# Patient Record
Sex: Female | Born: 1960 | Race: White | Hispanic: No | Smoking: Current every day smoker
Health system: Southern US, Community
[De-identification: ages and names within clinical notes are randomized; demographics above are authoritative.]

## PROBLEM LIST (undated history)

## (undated) DIAGNOSIS — Z9889 Other specified postprocedural states: Secondary | ICD-10-CM

## (undated) DIAGNOSIS — M51369 Other intervertebral disc degeneration, lumbar region without mention of lumbar back pain or lower extremity pain: Secondary | ICD-10-CM

## (undated) DIAGNOSIS — M5126 Other intervertebral disc displacement, lumbar region: Secondary | ICD-10-CM

## (undated) DIAGNOSIS — M810 Age-related osteoporosis without current pathological fracture: Secondary | ICD-10-CM

## (undated) DIAGNOSIS — I493 Ventricular premature depolarization: Secondary | ICD-10-CM

## (undated) DIAGNOSIS — F329 Major depressive disorder, single episode, unspecified: Secondary | ICD-10-CM

## (undated) DIAGNOSIS — S22070A Wedge compression fracture of T9-T10 vertebra, initial encounter for closed fracture: Secondary | ICD-10-CM

## (undated) DIAGNOSIS — F32A Depression, unspecified: Secondary | ICD-10-CM

## (undated) DIAGNOSIS — M5136 Other intervertebral disc degeneration, lumbar region: Secondary | ICD-10-CM

## (undated) HISTORY — DX: Age-related osteoporosis without current pathological fracture: M81.0

## (undated) HISTORY — DX: Ventricular premature depolarization: I49.3

## (undated) HISTORY — DX: Other intervertebral disc degeneration, lumbar region without mention of lumbar back pain or lower extremity pain: M51.369

## (undated) HISTORY — DX: Other intervertebral disc degeneration, lumbar region: M51.36

## (undated) HISTORY — DX: Wedge compression fracture of t9-t10 vertebra, initial encounter for closed fracture: S22.070A

## (undated) HISTORY — DX: Other intervertebral disc displacement, lumbar region: M51.26

## (undated) HISTORY — DX: Other specified postprocedural states: Z98.890

## (undated) HISTORY — DX: Major depressive disorder, single episode, unspecified: F32.9

## (undated) HISTORY — PX: WISDOM TOOTH EXTRACTION: SHX21

## (undated) HISTORY — DX: Depression, unspecified: F32.A

---

## 2004-02-26 ENCOUNTER — Inpatient Hospital Stay: Payer: Self-pay | Admitting: Internal Medicine

## 2007-05-22 ENCOUNTER — Ambulatory Visit: Payer: Self-pay

## 2011-06-15 ENCOUNTER — Encounter: Payer: Self-pay | Admitting: Physician Assistant

## 2011-06-15 ENCOUNTER — Ambulatory Visit: Payer: Self-pay | Admitting: General Practice

## 2011-06-28 ENCOUNTER — Ambulatory Visit: Payer: Self-pay | Admitting: Pain Medicine

## 2011-07-02 ENCOUNTER — Encounter: Payer: Self-pay | Admitting: Physician Assistant

## 2011-07-12 ENCOUNTER — Ambulatory Visit: Payer: Self-pay | Admitting: Pain Medicine

## 2011-07-14 ENCOUNTER — Ambulatory Visit: Payer: Self-pay | Admitting: Pain Medicine

## 2011-07-28 ENCOUNTER — Ambulatory Visit: Payer: Self-pay | Admitting: Pain Medicine

## 2011-08-01 ENCOUNTER — Encounter: Payer: Self-pay | Admitting: Physician Assistant

## 2011-09-01 ENCOUNTER — Encounter: Payer: Self-pay | Admitting: Physician Assistant

## 2012-09-25 ENCOUNTER — Other Ambulatory Visit: Payer: Self-pay | Admitting: Orthopedic Surgery

## 2012-10-03 ENCOUNTER — Ambulatory Visit: Payer: Self-pay | Admitting: Orthopedic Surgery

## 2012-10-11 ENCOUNTER — Ambulatory Visit: Payer: Self-pay | Admitting: Orthopedic Surgery

## 2013-06-18 ENCOUNTER — Ambulatory Visit: Payer: Self-pay | Admitting: Orthopedic Surgery

## 2014-05-12 ENCOUNTER — Encounter: Payer: Self-pay | Admitting: *Deleted

## 2014-05-31 IMAGING — CR DG THORACIC SPINE 2-3V
1 series · 4 of 4 positions shown · non-contrast
Comparison: none

REASON FOR EXAM: follow up compression fracture
COMMENTS:

[Series 1: t thoracic spine ap · 0.14mm/px · 4 of 4 slices shown]
[im 1/4]
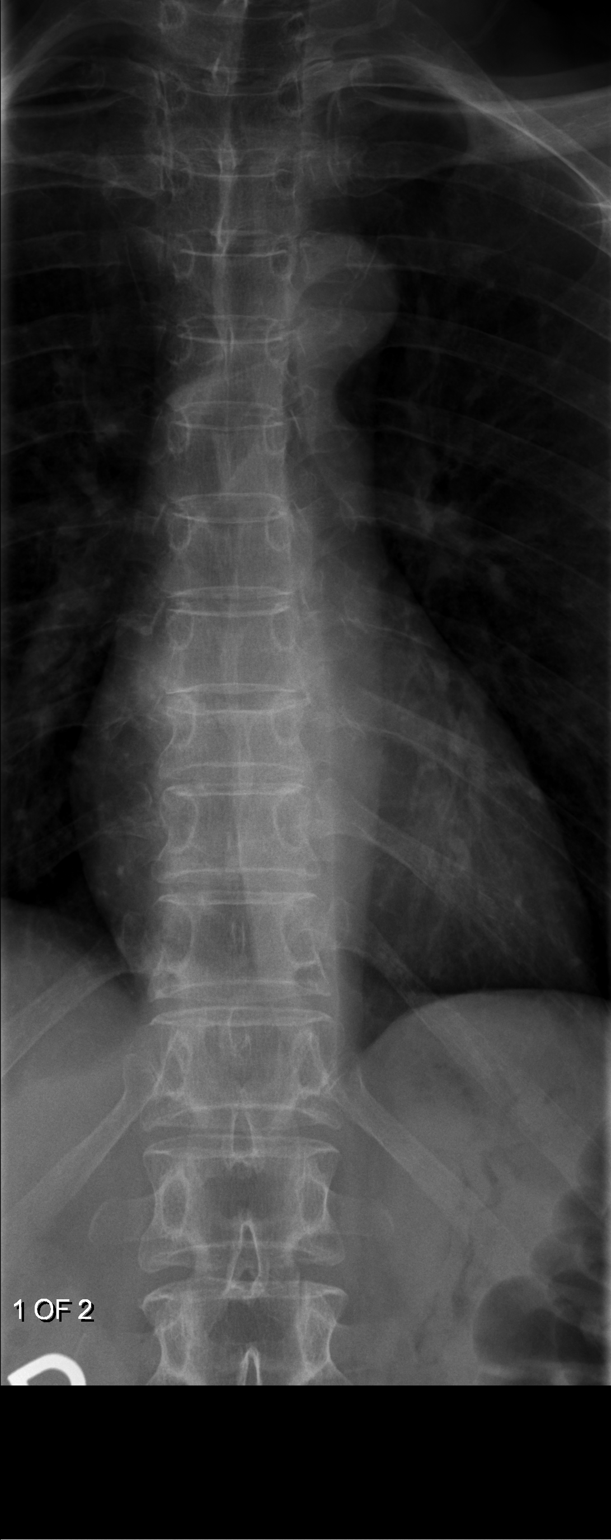
[im 2/4]
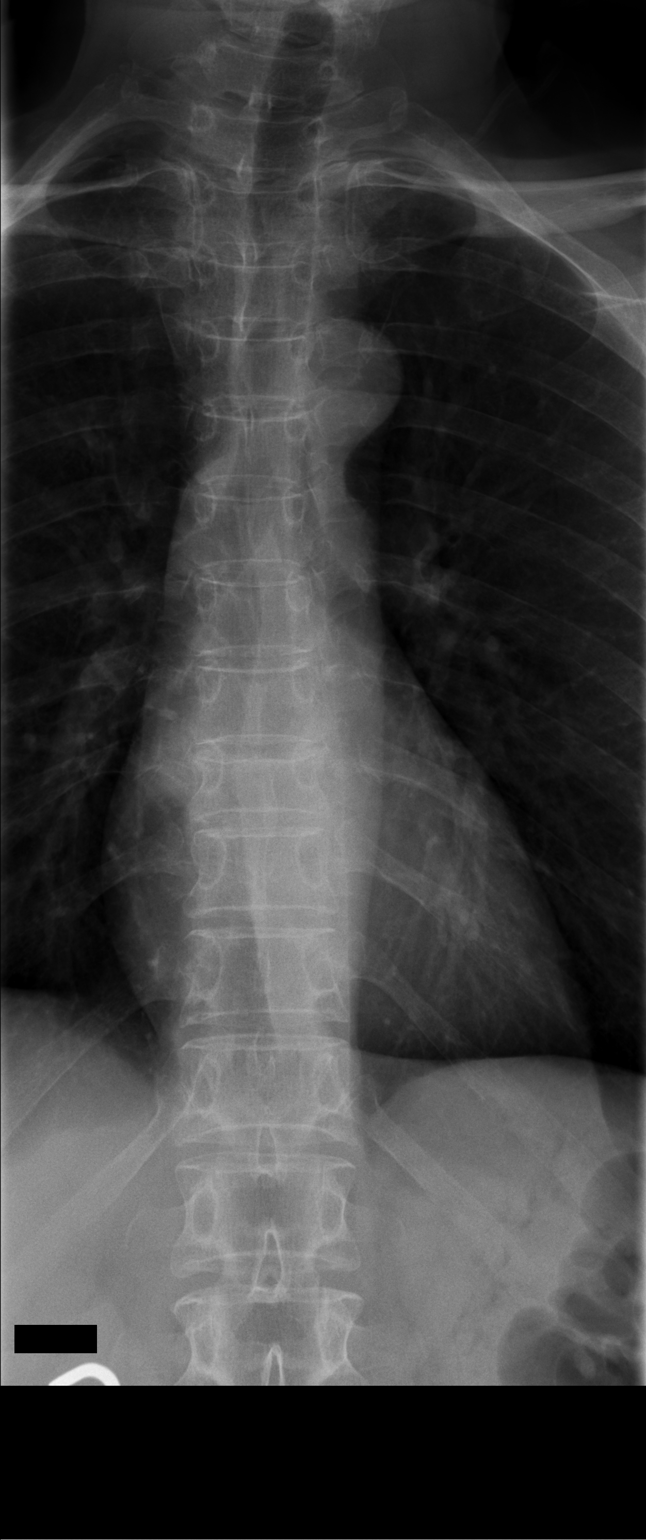
[im 3/4]
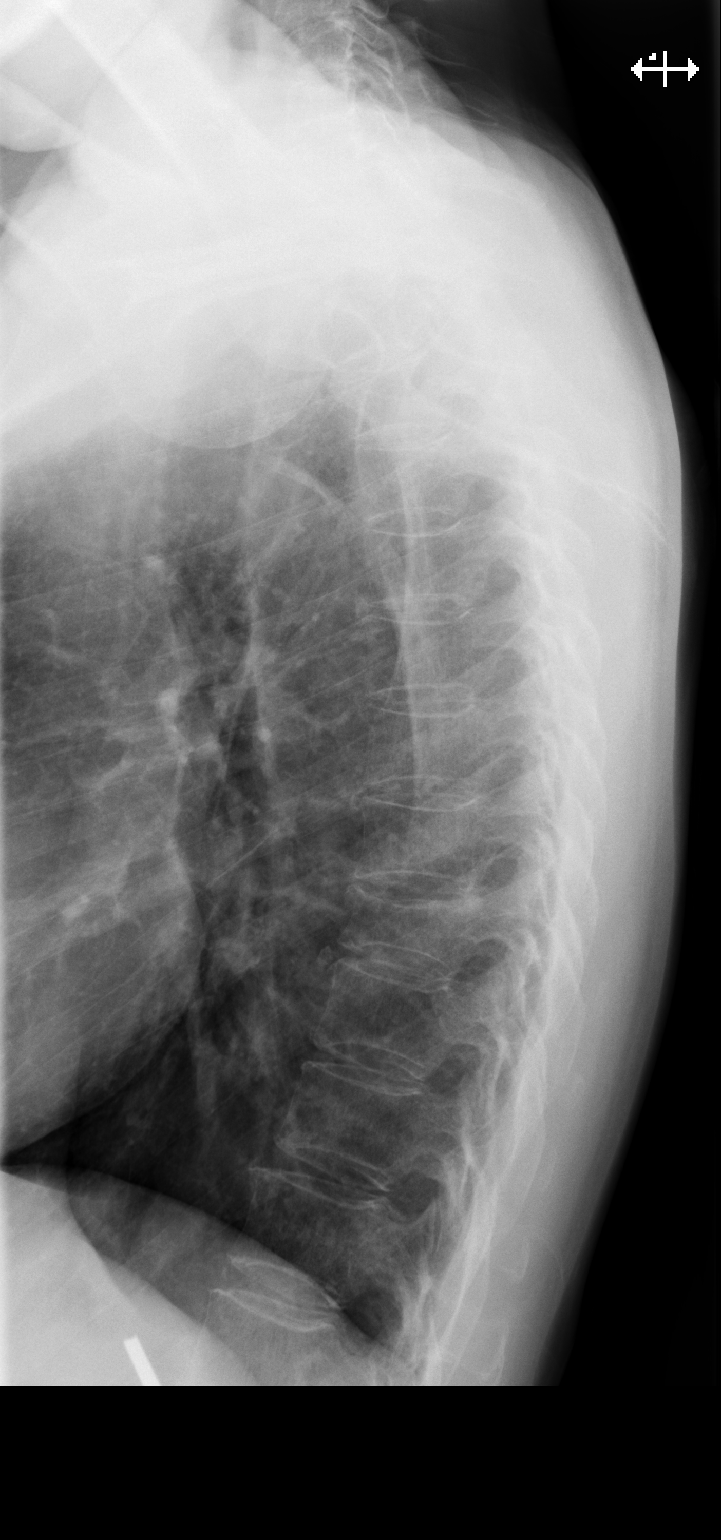
[im 4/4]
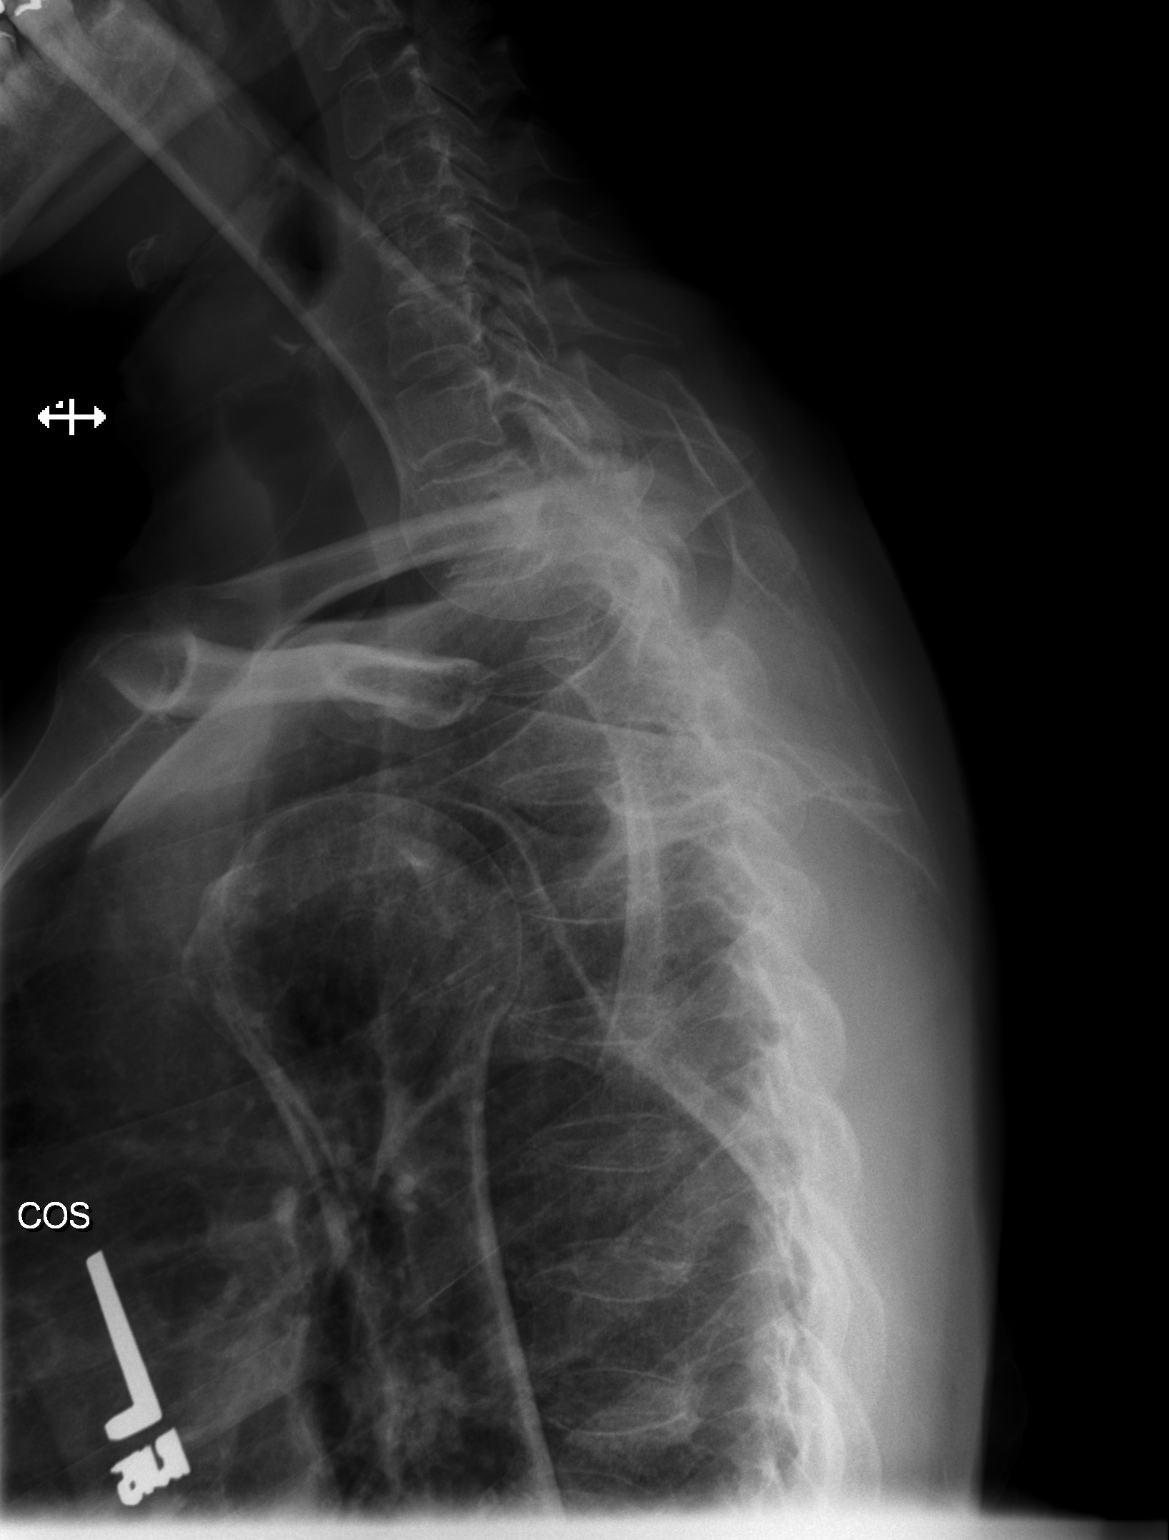

[4 of 4 positions shown; findings below may reference images not displayed]

PROCEDURE:     DXR - DXR THORACIC  AP AND LATERAL  - October 11, 2012  [DATE]

RESULT:     Comparison is made to the study September 25, 2012.

The thoracic vertebral bodies are diffusely osteopenic. There is mild
compression of the T9 vertebral body which has progressed slightly. The loss
of height anteriorly is approximately 40% and posteriorly is approximately
20%. No retropulsion of bone is demonstrated. There are no abnormal
paravertebral soft tissue densities. The pedicles appear intact were
visualized.
IMPRESSION: There is partial compression of body of T9 which has
slightly progressed since the previous study.

[REDACTED]

## 2015-02-24 ENCOUNTER — Ambulatory Visit (HOSPITAL_BASED_OUTPATIENT_CLINIC_OR_DEPARTMENT_OTHER): Payer: 59 | Admitting: Pain Medicine

## 2015-02-24 ENCOUNTER — Encounter: Payer: Self-pay | Admitting: Pain Medicine

## 2015-02-24 VITALS — BP 131/64 | HR 79 | Temp 98.5°F | Resp 16 | Ht 61.0 in | Wt 111.0 lb

## 2015-02-24 DIAGNOSIS — M79604 Pain in right leg: Secondary | ICD-10-CM

## 2015-02-24 DIAGNOSIS — G8929 Other chronic pain: Secondary | ICD-10-CM

## 2015-02-24 DIAGNOSIS — M47896 Other spondylosis, lumbar region: Secondary | ICD-10-CM

## 2015-02-24 DIAGNOSIS — M79605 Pain in left leg: Secondary | ICD-10-CM

## 2015-02-24 DIAGNOSIS — M792 Neuralgia and neuritis, unspecified: Secondary | ICD-10-CM | POA: Diagnosis not present

## 2015-02-24 DIAGNOSIS — M160 Bilateral primary osteoarthritis of hip: Secondary | ICD-10-CM

## 2015-02-24 DIAGNOSIS — M4806 Spinal stenosis, lumbar region: Secondary | ICD-10-CM

## 2015-02-24 DIAGNOSIS — M4306 Spondylolysis, lumbar region: Secondary | ICD-10-CM

## 2015-02-24 DIAGNOSIS — M5136 Other intervertebral disc degeneration, lumbar region: Secondary | ICD-10-CM | POA: Insufficient documentation

## 2015-02-24 DIAGNOSIS — M5416 Radiculopathy, lumbar region: Secondary | ICD-10-CM

## 2015-02-24 DIAGNOSIS — M4807 Spinal stenosis, lumbosacral region: Secondary | ICD-10-CM

## 2015-02-24 DIAGNOSIS — M5126 Other intervertebral disc displacement, lumbar region: Secondary | ICD-10-CM

## 2015-02-24 DIAGNOSIS — S22070S Wedge compression fracture of T9-T10 vertebra, sequela: Secondary | ICD-10-CM

## 2015-02-24 DIAGNOSIS — M5127 Other intervertebral disc displacement, lumbosacral region: Secondary | ICD-10-CM

## 2015-02-24 DIAGNOSIS — M51369 Other intervertebral disc degeneration, lumbar region without mention of lumbar back pain or lower extremity pain: Secondary | ICD-10-CM

## 2015-02-24 DIAGNOSIS — M545 Low back pain, unspecified: Secondary | ICD-10-CM | POA: Insufficient documentation

## 2015-02-24 DIAGNOSIS — M810 Age-related osteoporosis without current pathological fracture: Secondary | ICD-10-CM

## 2015-02-24 DIAGNOSIS — M47816 Spondylosis without myelopathy or radiculopathy, lumbar region: Secondary | ICD-10-CM | POA: Insufficient documentation

## 2015-02-24 DIAGNOSIS — M4317 Spondylolisthesis, lumbosacral region: Secondary | ICD-10-CM

## 2015-02-24 DIAGNOSIS — R52 Pain, unspecified: Secondary | ICD-10-CM

## 2015-02-24 DIAGNOSIS — Z5189 Encounter for other specified aftercare: Secondary | ICD-10-CM

## 2015-02-24 DIAGNOSIS — S22070A Wedge compression fracture of T9-T10 vertebra, initial encounter for closed fracture: Secondary | ICD-10-CM

## 2015-02-24 HISTORY — DX: Wedge compression fracture of T9-T10 vertebra, initial encounter for closed fracture: S22.070A

## 2015-02-24 MED ORDER — PREGABALIN 100 MG PO CAPS: 100.0000 mg | ORAL_CAPSULE | Freq: Every day | ORAL | Status: AC

## 2015-02-24 NOTE — Progress Notes (Signed)
Patient's Name: Carla Benson MRN: [id number] DOB: 16-Dec-1960 DOS: 02/24/2015  Primary Reason(s) for Visit: Evaluation of one or more chronic illness with mild exacerbation or progression CC: Back Pain   HPI  Ms. Karren is a 55 y.o. year old, female patient, who returns today as an established patient. She has Chronic low back pain (Bilateral) (L>R); Lower extremity pain (Bilateral) (L>R); Lumbar facet syndrome (Bilateral) (L>R); Neurogenic pain; Chronic lower extremity pain (Bilateral) (L>R) (referred from facet joints); Osteoarthritis of hip (Bilateral); History of lumbar radiculitis (intermittent) (Left) (L5); Lumbar bulging discs (broad-based disc bulge) (L3-4, L4-L5, and L5-S1); Lumbosacral foraminal stenosis (severe) (Left) (L5-S1); Herniated nucleus pulposus (Left) (L5-S1) (left foraminal, far lateral disc component which abuts the left extraforaminal L5 nerve root); Pain management at [facility]; Osteoporosis; Lumbar spondylosis; History of wedge compression fracture of T9 vertebra (HCC); Lumbar pars defect (Bilateral) (L5-S1); and Grade 1 Spondylolisthesis at L5-S1 level on her problem list.. Her primarily concern today is the Back Pain   The patient returns to the clinic today after last time being seen on 07/28/2011. At that time, the patient received the third in a series of 3 left sided interlaminar L5-S1 lumbar epidural steroid injections plus a left sided L5 selective nerve root block/transforaminal epidural steroid injection under fluoroscopic guidance. The patient indicates having done well for a while until recently when the pain started coming back. She is interested in getting this better since she has a goal of trying to run a marathon.  Reported Pain Score: 6 , clinically she looks like a 2-3/10. Reported level is inconsistent with clinical obrservations. Pain Type: Chronic pain Pain Location: Back Pain Orientation: Lower Pain Descriptors /  Indicators: Sharp, Radiating, Discomfort, Constant Pain Frequency: Constant  Last visit: 07/28/2011 Purpose: Left L5-S1 interlaminar epidural steroid injection plus left L4-5 transforaminal epidural steroid injection #3.  Pharmacotherapy  Medication(s): We have not prescribed any medications for this patient. According to the records, the only thing that she is taking is ibuprofen 800 mg 1 tablet by mouth daily to 3 times a day when necessary for pain.  Lab Work: Illicit Drugs No results found for: THCU, COCAINSCRNUR, PCPSCRNUR, MDMA, AMPHETMU, METHADONE, ETOH  Inflammation Markers No results found for: ESRSEDRATE, CRP  Renal Function No results found for: BUN, CREATININE, GFRAA, GFRNONAA  Hepatic Function No results found for: AST, ALT, ALBUMIN  Electrolytes No results found for: NA, K, CL, CALCIUM, MG  Allergies  Ms. Eisenhuth has no allergies on file.  Meds  The patient has a current medication list which includes the following prescription(s): ibuprofen and pregabalin.  No current outpatient prescriptions on file prior to visit.   No current facility-administered medications on file prior to visit.    ROS  Constitutional: Afebrile, no chills, well hydrated and well nourished Gastrointestinal: negative Musculoskeletal:negative Neurological: negative Behavioral/Psych: negative  PFSH  Medical:  Ms. Jacobson  has a past medical history of Depression; PVC (premature ventricular contraction); Bulging lumbar disc; Osteoporosis; cardiac cath; and History of wedge compression fracture of T9 vertebra (HCC) (02/24/2015). Family: family history includes Alzheimer's disease in her father; Brain cancer in her mother; COPD in her father. Surgical:  has past surgical history that includes Cesarean section; Cesarean section with bilateral tubal ligation; and Wisdom tooth extraction. Tobacco:  reports that she has been smoking Cigarettes.  She has a 15 pack-year smoking history. She does not  have any smokeless tobacco history on file. Alcohol:  reports that she drinks alcohol. Drug:  reports that she does not use illicit drugs.  Physical Exam  Vitals:  Today's Vitals   02/24/15 1421 02/24/15 1426  BP: 131/64   Pulse: 79   Temp: 98.5 F (36.9 C)   TempSrc: Oral   Resp: 16   Height:  (1.549 m)   Weight: 111 lb (50.349 kg)   SpO2: 100%   PainSc:  6     Calculated BMI: Body mass index is 20.98 kg/(m^2).  General appearance: alert, cooperative, appears stated age and mild distress Eyes: PERLA Respiratory: No evidence respiratory distress, no audible rales or ronchi and no use of accessory muscles of respiration  Thoracic Spine Inspection: No gross anomalies detected Alignment: Symetrical ROM: Adequate  Lumbar Spine Inspection: No gross anomalies detected Alignment: Symetrical ROM: Decreased hyperextension Palpation: Tender Provocative Tests: Lumbar Hyperextension and rotation test: Positive bilaterally Patrick's Maneuver: Negative bilaterally for pain arising from the sacroiliac joint, but positive bilaterally for pain arising from the hip joint. Gait: WNL  Lower Extremities Inspection: No gross anomalies detected ROM: Adequate Sensory: Normal Motor: Unremarkable  Toe walk (S1): WNL  Heal walk (L5): WNL Pulses: Palpable DTR:  Patellar (L4): WNL Achilles (S1): WNL  Assessment & Plan  Primary Diagnosis & Pertinent Problem List: The primary encounter diagnosis was Chronic low back pain. Diagnoses of Bilateral lower extremity pain, Facet syndrome, lumbar, Neurogenic pain, Chronic lower extremity pain (Left), Primary osteoarthritis of both hips, History of lumbar radiculitis (intermittent) (Left) (L5), Lumbar bulging discs (broad-based disc bulge) (L3-4, L4-L5, and L5-S1), Lumbosacral foraminal stenosis (severe) (Left) (L5-S1), Herniated nucleus pulposus (Left) (L5-S1) (left foraminal, far lateral disc component which abuts the left extraforaminal L5  nerve root), Pain management at [facility], Osteoporosis, Other osteoarthritis of spine, lumbar region, Closed wedge compression fracture of ninth thoracic vertebra, sequela (HCC), Lumbar pars defect (Bilateral) (L5-S1), and Grade 1 Spondylolisthesis at L5-S1 level were also pertinent to this visit.  Visit Diagnosis: 1. Chronic low back pain   2. Bilateral lower extremity pain   3. Facet syndrome, lumbar   4. Neurogenic pain   5. Chronic lower extremity pain (Left)   6. Primary osteoarthritis of both hips   7. History of lumbar radiculitis (intermittent) (Left) (L5)   8. Lumbar bulging discs (broad-based disc bulge) (L3-4, L4-L5, and L5-S1)   9. Lumbosacral foraminal stenosis (severe) (Left) (L5-S1)   10. Herniated nucleus pulposus (Left) (L5-S1) (left foraminal, far lateral disc component which abuts the left extraforaminal L5 nerve root)   11. Pain management at [facility]   12. Osteoporosis   13. Other osteoarthritis of spine, lumbar region   14. Closed wedge compression fracture of ninth thoracic vertebra, sequela (HCC)   15. Lumbar pars defect (Bilateral) (L5-S1)   16. Grade 1 Spondylolisthesis at L5-S1 level     Assessment: Pain management at [facility] The patient was initially evaluated at [facility] on 06/28/2011. At that time, the symptoms were dose of a left lumbar radiculitis. Lumbar MRI done on 06/15/2011. Treatment done: Left L5-S1 translaminar epidural steroid injection plus left L5-S1 transforaminal epidural steroid injection under fluoroscopic guidance x3. (06/28/2011, 07/14/2011, and 07/28/2011). X-rays of the lumbar spine done on 09/25/2012 show a grade 1 spondylolisthesis of L5 over S1 secondary to bilateral pars defects. X-rays of the thoracic spine done on 09/25/2012 show vertebral osteopenia with a mild partial compression fracture of T9 with approximately 20% loss of height  anteriorly. No more than 5-10% posterior height  loss. Bone density test done on 10/03/2012 reveals osteoporosis with high risk for fractures. X-rays of the thoracic spine done on 10/11/2012 demonstrated progression of the T9 vertebral body fracture to 40% anteriorly and 20% posteriorly.    Plan of Care  Pharmacotherapy (Medications Ordered): Meds ordered this encounter  Medications  . pregabalin (LYRICA) 100 MG capsule    Sig: Take 1-3 capsules (100-300 mg total) by mouth at bedtime. Follow titration schedule.    Dispense:  90 capsule    Refill:  0    Do not place this medication, or any other prescription from our practice, on "Automatic Refill". Patient may have prescription filled one day early if pharmacy is closed on scheduled refill date.    Lab-work & Procedure Ordered: Orders Placed This Encounter  Procedures  . LUMBAR FACET(MEDIAL BRANCH NERVE BLOCK) MBNB    Standing Status: Standing     Number of Occurrences: 1     Standing Expiration Date: 02/24/2016    Scheduling Instructions:     Side: Bilateral     Level: L2, L3, L4, L5, & S1 Medial Branch Nerve     Sedation: With Sedation.     Timeframe: PRN Procedure. Patient will call to schedule.    Order Specific Question:  Where will this procedure be performed?    Answer:  [facility]  . DG Lumbar Spine Complete W/Bend    Standing Status: Future     Number of Occurrences:      Standing Expiration Date: 02/24/2016    Scheduling Instructions:     Please include flexion and extension views and report any spinal instability (>4 mm displacement of any spondylolisthesis). If present, please report any spondylolisthesis grade, as well as displacement in millimeters.    Order Specific Question:  Reason for Exam (SYMPTOM  OR DIAGNOSIS REQUIRED)    Answer:  Low back pain    Order Specific Question:  Is the patient pregnant?    Answer:  No    Order Specific Question:  Preferred imaging location?    Answer:  [facility]     Order Specific Question:  Call Results- Best Contact Number?    Answer:  [phone] (Pain Clinic facility) (Dr. Laban Emperor)    Imaging Ordered: DG LUMBAR SPINE COMPLETE W/BEND 6+V  Interventional Therapies: Scheduled: None at this point. PRN Procedures: The patient was offered a diagnostic, bilateral, lumbar facet block under fluoroscopic guidance and IV sedation.    Referral(s) or Consult(s): None required at this point.  Medications administered during this visit: Ms. Lobello does not currently have medications on file.  No future appointments.  Primary Care Physician: No primary care provider on file. Location: [facility] Note by: Delmore Sear A. Laban Emperor, M.D, DABA, DABAPM, DABPM, DABIPP, FIPP

## 2015-02-24 NOTE — Progress Notes (Signed)
Safety precautions to be maintained throughout the outpatient stay will include: orient to surroundings, keep bed in low position, maintain call bell within reach at all times, provide assistance with transfer out of bed and ambulation.  

## 2015-02-24 NOTE — Assessment & Plan Note (Signed)
The patient was initially evaluated at [facility] on 06/28/2011. At that time, the symptoms were dose of a left lumbar radiculitis. Lumbar MRI done on 06/15/2011. Treatment done: Left L5-S1 translaminar epidural steroid injection plus left L5-S1 transforaminal epidural steroid injection under fluoroscopic guidance x3. (06/28/2011, 07/14/2011, and 07/28/2011). X-rays of the lumbar spine done on 09/25/2012 show a grade 1 spondylolisthesis of L5 over S1 secondary to bilateral pars defects. X-rays of the thoracic spine done on 09/25/2012 show vertebral osteopenia with a mild partial compression fracture of T9 with approximately 20% loss of height anteriorly. No more than 5-10% posterior height loss. Bone density test done on 10/03/2012 reveals osteoporosis with high risk for fractures. X-rays of the thoracic spine done on 10/11/2012 demonstrated progression of the T9 vertebral body fracture to 40% anteriorly and 20% posteriorly.

## 2015-02-24 NOTE — Patient Instructions (Signed)
Facet Joint Block The facet joints connect the bones of the spine (vertebrae). They make it possible for you to bend, twist, and make other movements with your spine. They also prevent you from overbending, overtwisting, and making other excessive movements.  A facet joint block is a procedure where a numbing medicine (anesthetic) is injected into a facet joint. Often, a type of anti-inflammatory medicine called a steroid is also injected. A facet joint block may be done for two reasons:   Diagnosis. A facet joint block may be done as a test to see whether neck or back pain is caused by a worn-down or infected facet joint. If the pain gets better after a facet joint block, it means the pain is probably coming from the facet joint. If the pain does not get better, it means the pain is probably not coming from the facet joint.   Therapy. A facet joint block may be done to relieve neck or back pain caused by a facet joint. A facet joint block is only done as a therapy if the pain does not improve with medicine, exercise programs, physical therapy, and other forms of pain management. LET YOUR HEALTH CARE PROVIDER KNOW ABOUT:   Any allergies you have.   All medicines you are taking, including vitamins, herbs, eyedrops, and over-the-counter medicines and creams.   Previous problems you or members of your family have had with the use of anesthetics.   Any blood disorders you have had.   Other health problems you have. RISKS AND COMPLICATIONS Generally, having a facet joint block is safe. However, as with any procedure, complications can occur. Possible complications associated with having a facet joint block include:   Bleeding.   Injury to a nerve near the injection site.   Pain at the injection site.   Weakness or numbness in areas controlled by nerves near the injection site.   Infection.   Temporary fluid retention.   Allergic reaction to anesthetics or medicines used during  the procedure. BEFORE THE PROCEDURE   Follow your health care provider's instructions if you are taking dietary supplements or medicines. You may need to stop taking them or reduce your dosage.   Do not take any new dietary supplements or medicines without asking your health care provider first.   Follow your health care provider's instructions about eating and drinking before the procedure. You may need to stop eating and drinking several hours before the procedure.   Arrange to have an adult drive you home after the procedure. PROCEDURE  You may need to remove your clothing and dress in an open-back gown so that your health care provider can access your spine.   The procedure will be done while you are lying on an X-ray table. Most of the time you will be asked to lie on your stomach, but you may be asked to lie in a different position if an injection will be made in your neck.   Special machines will be used to monitor your oxygen levels, heart rate, and blood pressure.   If an injection will be made in your neck, an intravenous (IV) tube will be inserted into one of your veins. Fluids and medicine will flow directly into your body through the IV tube.   The area over the facet joint where the injection will be made will be cleaned with an antiseptic soap. The surrounding skin will be covered with sterile drapes.   An anesthetic will be applied to your skin   to make the injection area numb. You may feel a temporary stinging or burning sensation.   A video X-ray machine will be used to locate the joint. A contrast dye may be injected into the facet joint area to help with locating the joint.   When the joint is located, an anesthetic medicine will be injected into the joint through the needle.   Your health care provider will ask you whether you feel pain relief. If you do feel relief, a steroid may be injected to provide pain relief for a longer period of time. If you do not  feel relief or feel only partial relief, additional injections of an anesthetic may be made in other facet joints.   The needle will be removed, the skin will be cleansed, and bandages will be applied.  AFTER THE PROCEDURE   You will be observed for 15-30 minutes before being allowed to go home. Do not drive. Have an adult drive you or take a taxi or public transportation instead.   If you feel pain relief, the pain will return in several hours or days when the anesthetic wears off.   You may feel pain relief 2-14 days after the procedure. The amount of time this relief lasts varies from person to person.   It is normal to feel some tenderness over the injected area(s) for 2 days following the procedure.   If you have diabetes, you may have a temporary increase in blood sugar.   This information is not intended to replace advice given to you by your health care provider. Make sure you discuss any questions you have with your health care provider.   Document Released: 06/08/2006 Document Revised: 02/07/2014 Document Reviewed: 11/07/2011 Elsevier Interactive Patient Education 2016 [employer] GENERAL RISKS AND COMPLICATIONS  What are the risk, side effects and possible complications? Generally speaking, most procedures are safe.  However, with any procedure there are risks, side effects, and the possibility of complications.  The risks and complications are dependent upon the sites that are lesioned, or the type of nerve block to be performed.  The closer the procedure is to the spine, the more serious the risks are.  Great care is taken when placing the radio frequency needles, block needles or lesioning probes, but sometimes complications can occur.  Infection: Any time there is an injection through the skin, there is a risk of infection.  This is why sterile conditions are used for these blocks.  There are four possible types of infection.  Localized skin infection.  Central  Nervous System Infection-This can be in the form of Meningitis, which can be deadly.  Epidural Infections-This can be in the form of an epidural abscess, which can cause pressure inside of the spine, causing compression of the spinal cord with subsequent paralysis. This would require an emergency surgery to decompress, and there are no guarantees that the patient would recover from the paralysis.  Discitis-This is an infection of the intervertebral discs.  It occurs in about 1% of discography procedures.  It is difficult to treat and it may lead to surgery.        2. Pain: the needles have to go through skin and soft tissues, will cause soreness.       3. Damage to internal structures:  The nerves to be lesioned may be near blood vessels or    other nerves which can be potentially damaged.       4. Bleeding: Bleeding is more common if   the patient is taking blood thinners such as  aspirin, Coumadin, Ticiid, Plavix, etc., or if he/she have some genetic predisposition  such as hemophilia. Bleeding into the spinal canal can cause compression of the spinal  cord with subsequent paralysis.  This would require an emergency surgery to  decompress and there are no guarantees that the patient would recover from the  paralysis.       5. Pneumothorax:  Puncturing of a lung is a possibility, every time a needle is introduced in  the area of the chest or upper back.  Pneumothorax refers to free air around the  collapsed lung(s), inside of the thoracic cavity (chest cavity).  Another two possible  complications related to a similar event would include: Hemothorax and Chylothorax.   These are variations of the Pneumothorax, where instead of air around the collapsed  lung(s), you may have blood or chyle, respectively.       6. Spinal headaches: They may occur with any procedures in the area of the spine.       7. Persistent CSF (Cerebro-Spinal Fluid) leakage: This is a rare problem, but may occur  with prolonged  intrathecal or epidural catheters either due to the formation of a fistulous  track or a dural tear.       8. Nerve damage: By working so close to the spinal cord, there is always a possibility of  nerve damage, which could be as serious as a permanent spinal cord injury with  paralysis.       9. Death:  Although rare, severe deadly allergic reactions known as "Anaphylactic  reaction" can occur to any of the medications used.      10. Worsening of the symptoms:  We can always make thing worse.  What are the chances of something like this happening? Chances of any of this occuring are extremely low.  By statistics, you have more of a chance of getting killed in a motor vehicle accident: while driving to the hospital than any of the above occurring .  Nevertheless, you should be aware that they are possibilities.  In general, it is similar to taking a shower.  Everybody knows that you can slip, hit your head and get killed.  Does that mean that you should not shower again?  Nevertheless always keep in mind that statistics do not mean anything if you happen to be on the wrong side of them.  Even if a procedure has a 1 (one) in a 1,000,000 (million) chance of going wrong, it you happen to be that one..Also, keep in mind that by statistics, you have more of a chance of having something go wrong when taking medications.  Who should not have this procedure? If you are on a blood thinning medication (e.g. Coumadin, Plavix, see list of "Blood Thinners"), or if you have an active infection going on, you should not have the procedure.  If you are taking any blood thinners, please inform your physician.  How should I prepare for this procedure?  Do not eat or drink anything at least six hours prior to the procedure.  Bring a driver with you .  It cannot be a taxi.  Come accompanied by an adult that can drive you back, and that is strong enough to help you if your legs get weak or numb from the local  anesthetic.  Take all of your medicines the morning of the procedure with just enough water to swallow them.  If you have   diabetes, make sure that you are scheduled to have your procedure done first thing in the morning, whenever possible.  If you have diabetes, take only half of your insulin dose and notify our nurse that you have done so as soon as you arrive at the clinic.  If you are diabetic, but only take blood sugar pills (oral hypoglycemic), then do not take them on the morning of your procedure.  You may take them after you have had the procedure.  Do not take aspirin or any aspirin-containing medications, at least eleven (11) days prior to the procedure.  They may prolong bleeding.  Wear loose fitting clothing that may be easy to take off and that you would not mind if it got stained with Betadine or blood.  Do not wear any jewelry or perfume  Remove any nail coloring.  It will interfere with some of our monitoring equipment.  NOTE: Remember that this is not meant to be interpreted as a complete list of all possible complications.  Unforeseen problems may occur.  BLOOD THINNERS The following drugs contain aspirin or other products, which can cause increased bleeding during surgery and should not be taken for 2 weeks prior to and 1 week after surgery.  If you should need take something for relief of minor pain, you may take acetaminophen which is found in Tylenol,m Datril, Anacin-3 and Panadol. It is not blood thinner. The products listed below are.  Do not take any of the products listed below in addition to any listed on your instruction sheet.  A.P.C or A.P.C with Codeine Codeine Phosphate Capsules #3 Ibuprofen Ridaura  ABC compound Congesprin Imuran rimadil  Advil Cope Indocin Robaxisal  Alka-Seltzer Effervescent Pain Reliever and Antacid Coricidin or Coricidin-D  Indomethacin Rufen  Alka-Seltzer plus Cold Medicine Cosprin Ketoprofen S-A-C Tablets  Anacin Analgesic Tablets  or Capsules Coumadin Korlgesic Salflex  Anacin Extra Strength Analgesic tablets or capsules CP-2 Tablets Lanoril Salicylate  Anaprox Cuprimine Capsules Levenox Salocol  Anexsia-D Dalteparin Magan Salsalate  Anodynos Darvon compound Magnesium Salicylate Sine-off  Ansaid Dasin Capsules Magsal Sodium Salicylate  Anturane Depen Capsules Marnal Soma  APF Arthritis pain formula Dewitt's Pills Measurin Stanback  Argesic Dia-Gesic Meclofenamic Sulfinpyrazone  Arthritis Bayer Timed Release Aspirin Diclofenac Meclomen Sulindac  Arthritis pain formula Anacin Dicumarol Medipren Supac  Analgesic (Safety coated) Arthralgen Diffunasal Mefanamic Suprofen  Arthritis Strength Bufferin Dihydrocodeine Mepro Compound Suprol  Arthropan liquid Dopirydamole Methcarbomol with Aspirin Synalgos  ASA tablets/Enseals Disalcid Micrainin Tagament  Ascriptin Doan's Midol Talwin  Ascriptin A/D Dolene Mobidin Tanderil  Ascriptin Extra Strength Dolobid Moblgesic Ticlid  Ascriptin with Codeine Doloprin or Doloprin with Codeine Momentum Tolectin  Asperbuf Duoprin Mono-gesic Trendar  Aspergum Duradyne Motrin or Motrin IB Triminicin  Aspirin plain, buffered or enteric coated Durasal Myochrisine Trigesic  Aspirin Suppositories Easprin Nalfon Trillsate  Aspirin with Codeine Ecotrin Regular or Extra Strength Naprosyn Uracel  Atromid-S Efficin Naproxen Ursinus  Auranofin Capsules Elmiron Neocylate Vanquish  Axotal Emagrin Norgesic Verin  Azathioprine Empirin or Empirin with Codeine Normiflo Vitamin E  Azolid Emprazil Nuprin Voltaren  Bayer Aspirin plain, buffered or children's or timed BC Tablets or powders Encaprin Orgaran Warfarin Sodium  Buff-a-Comp Enoxaparin Orudis Zorpin  Buff-a-Comp with Codeine Equegesic Os-Cal-Gesic   Buffaprin Excedrin plain, buffered or Extra Strength Oxalid   Bufferin Arthritis Strength Feldene Oxphenbutazone   Bufferin plain or Extra Strength Feldene Capsules Oxycodone with Aspirin   Bufferin  with Codeine Fenoprofen Fenoprofen Pabalate or Pabalate-SF   Buffets II Flogesic Panagesic     Buffinol plain or Extra Strength Florinal or Florinal with Codeine Panwarfarin   Buf-Tabs Flurbiprofen Penicillamine   Butalbital Compound Four-way cold tablets Penicillin   Butazolidin Fragmin Pepto-Bismol   Carbenicillin Geminisyn Percodan   Carna Arthritis Reliever Geopen Persantine   Carprofen Gold's salt Persistin   Chloramphenicol Goody's Phenylbutazone   Chloromycetin Haltrain Piroxlcam   Clmetidine heparin Plaquenil   Cllnoril Hyco-pap Ponstel   Clofibrate Hydroxy chloroquine Propoxyphen         Before stopping any of these medications, be sure to consult the physician who ordered them.  Some, such as Coumadin (Warfarin) are ordered to prevent or treat serious conditions such as "deep thrombosis", "pumonary embolisms", and other heart problems.  The amount of time that you may need off of the medication may also vary with the medication and the reason for which you were taking it.  If you are taking any of these medications, please make sure you notify your pain physician before you undergo any procedures.          

## 2015-02-25 ENCOUNTER — Telehealth: Payer: Self-pay

## 2015-02-25 NOTE — Telephone Encounter (Signed)
Called 02/25/15  and LM to come fill out new patient paper work and get Prescription Recalled 02/25/15 @ 1315 and LM Recalled 02/25/15  and LM

## 2015-02-26 ENCOUNTER — Ambulatory Visit
Admission: RE | Admit: 2015-02-26 | Discharge: 2015-02-26 | Disposition: A | Discharge status: Home / Self Care | Payer: 59 | Source: Ambulatory Visit | Attending: Pain Medicine | Admitting: Pain Medicine

## 2015-02-26 DIAGNOSIS — G8929 Other chronic pain: Secondary | ICD-10-CM | POA: Diagnosis not present

## 2015-02-26 DIAGNOSIS — F17210 Nicotine dependence, cigarettes, uncomplicated: Secondary | ICD-10-CM | POA: Insufficient documentation

## 2015-02-26 DIAGNOSIS — M4854XA Collapsed vertebra, not elsewhere classified, thoracic region, initial encounter for fracture: Secondary | ICD-10-CM | POA: Diagnosis not present

## 2015-02-26 DIAGNOSIS — M160 Bilateral primary osteoarthritis of hip: Secondary | ICD-10-CM | POA: Insufficient documentation

## 2015-02-26 DIAGNOSIS — M4807 Spinal stenosis, lumbosacral region: Secondary | ICD-10-CM | POA: Diagnosis not present

## 2015-02-26 DIAGNOSIS — M47816 Spondylosis without myelopathy or radiculopathy, lumbar region: Secondary | ICD-10-CM | POA: Diagnosis not present

## 2015-02-26 DIAGNOSIS — M5126 Other intervertebral disc displacement, lumbar region: Secondary | ICD-10-CM | POA: Insufficient documentation

## 2015-02-26 DIAGNOSIS — M4316 Spondylolisthesis, lumbar region: Secondary | ICD-10-CM | POA: Insufficient documentation

## 2015-02-26 DIAGNOSIS — M545 Low back pain, unspecified: Secondary | ICD-10-CM

## 2015-02-26 DIAGNOSIS — M79604 Pain in right leg: Secondary | ICD-10-CM | POA: Insufficient documentation

## 2015-02-26 DIAGNOSIS — M79605 Pain in left leg: Secondary | ICD-10-CM | POA: Insufficient documentation

## 2015-02-26 DIAGNOSIS — M810 Age-related osteoporosis without current pathological fracture: Secondary | ICD-10-CM | POA: Insufficient documentation

## 2015-02-26 DIAGNOSIS — F329 Major depressive disorder, single episode, unspecified: Secondary | ICD-10-CM | POA: Insufficient documentation

## 2015-03-02 ENCOUNTER — Telehealth: Payer: Self-pay | Admitting: Pain Medicine

## 2015-03-02 NOTE — Telephone Encounter (Signed)
Would like results of xray done 02-26-15 please call her at [phone] until 4pm or home

## 2015-03-02 NOTE — Telephone Encounter (Signed)
Results given to patient

## 2015-03-11 DIAGNOSIS — F329 Major depressive disorder, single episode, unspecified: Secondary | ICD-10-CM | POA: Diagnosis not present

## 2015-03-11 DIAGNOSIS — F419 Anxiety disorder, unspecified: Secondary | ICD-10-CM | POA: Diagnosis not present

## 2015-03-14 NOTE — Progress Notes (Signed)
Quick Note:  This imaging study has been reviewed and not found to have any major pathology requiring urgent or emergency care. Imaging reveals: Diffuse degenerative change lumbar spine. Stable L5-S1 spondylolisthesis with bilateral pars defects. No interim change from 09/25/2012. No acute abnormality.  Consider LESIs vs Lumbar Facet Blocks. ______

## 2015-09-21 ENCOUNTER — Encounter: Payer: Self-pay | Admitting: Physician Assistant

## 2015-09-21 ENCOUNTER — Ambulatory Visit: Payer: Self-pay | Admitting: Physician Assistant

## 2015-09-21 DIAGNOSIS — L237 Allergic contact dermatitis due to plants, except food: Secondary | ICD-10-CM

## 2015-09-21 MED ORDER — DEXAMETHASONE SODIUM PHOSPHATE 10 MG/ML IJ SOLN
10.0000 mg | Freq: Once | INTRAMUSCULAR | Status: AC
  Administered 2015-09-21: 10 mg via INTRAMUSCULAR

## 2015-09-21 NOTE — Progress Notes (Signed)
S: c/o itchy rash on hands, arms, legs, was outside in yard and then broke out, sx for few days, tried multiple otc meds without relief, denies fever/chills  O: vitals wnl, nad, lungs c t a, cv rrr, skin with small raised red areas some with streaks/blisters, no drainage, n/v intact  A: acute contact dermatitis  P: decadron 10 mg im

## 2016-01-08 DIAGNOSIS — F419 Anxiety disorder, unspecified: Secondary | ICD-10-CM | POA: Diagnosis not present

## 2016-01-08 DIAGNOSIS — F329 Major depressive disorder, single episode, unspecified: Secondary | ICD-10-CM | POA: Diagnosis not present

## 2016-05-11 DIAGNOSIS — F329 Major depressive disorder, single episode, unspecified: Secondary | ICD-10-CM | POA: Diagnosis not present

## 2016-05-11 DIAGNOSIS — F419 Anxiety disorder, unspecified: Secondary | ICD-10-CM | POA: Diagnosis not present

## 2016-06-16 DIAGNOSIS — M4316 Spondylolisthesis, lumbar region: Secondary | ICD-10-CM | POA: Diagnosis not present

## 2016-06-16 DIAGNOSIS — M5416 Radiculopathy, lumbar region: Secondary | ICD-10-CM | POA: Diagnosis not present

## 2016-06-17 ENCOUNTER — Other Ambulatory Visit: Payer: Self-pay | Admitting: Orthopedic Surgery

## 2016-06-17 DIAGNOSIS — M5416 Radiculopathy, lumbar region: Secondary | ICD-10-CM

## 2016-07-05 ENCOUNTER — Ambulatory Visit
Admission: RE | Admit: 2016-07-05 | Discharge: 2016-07-05 | Disposition: A | Discharge status: Home / Self Care | Payer: 59 | Source: Ambulatory Visit | Attending: Orthopedic Surgery | Admitting: Orthopedic Surgery

## 2016-07-05 DIAGNOSIS — M4316 Spondylolisthesis, lumbar region: Secondary | ICD-10-CM | POA: Diagnosis not present

## 2016-07-05 DIAGNOSIS — M48061 Spinal stenosis, lumbar region without neurogenic claudication: Secondary | ICD-10-CM | POA: Insufficient documentation

## 2016-07-05 DIAGNOSIS — M5416 Radiculopathy, lumbar region: Secondary | ICD-10-CM | POA: Diagnosis not present

## 2016-07-05 DIAGNOSIS — M545 Low back pain: Secondary | ICD-10-CM | POA: Diagnosis not present

## 2016-07-28 DIAGNOSIS — M4306 Spondylolysis, lumbar region: Secondary | ICD-10-CM | POA: Diagnosis not present

## 2016-07-28 DIAGNOSIS — M47816 Spondylosis without myelopathy or radiculopathy, lumbar region: Secondary | ICD-10-CM | POA: Diagnosis not present

## 2016-07-28 DIAGNOSIS — F419 Anxiety disorder, unspecified: Secondary | ICD-10-CM | POA: Diagnosis not present

## 2016-07-28 DIAGNOSIS — F329 Major depressive disorder, single episode, unspecified: Secondary | ICD-10-CM | POA: Diagnosis not present

## 2016-09-26 DIAGNOSIS — F419 Anxiety disorder, unspecified: Secondary | ICD-10-CM | POA: Diagnosis not present

## 2016-09-26 DIAGNOSIS — F329 Major depressive disorder, single episode, unspecified: Secondary | ICD-10-CM | POA: Diagnosis not present

## 2016-10-05 ENCOUNTER — Ambulatory Visit
Admission: RE | Admit: 2016-10-05 | Discharge: 2016-10-05 | Disposition: A | Discharge status: Home / Self Care | Payer: 59 | Source: Ambulatory Visit | Attending: Physician Assistant | Admitting: Physician Assistant

## 2016-10-05 ENCOUNTER — Encounter: Payer: Self-pay | Admitting: Physician Assistant

## 2016-10-05 ENCOUNTER — Ambulatory Visit: Payer: Self-pay | Admitting: Physician Assistant

## 2016-10-05 VITALS — BP 120/84 | HR 83 | Temp 98.3°F

## 2016-10-05 DIAGNOSIS — X58XXXD Exposure to other specified factors, subsequent encounter: Secondary | ICD-10-CM | POA: Insufficient documentation

## 2016-10-05 DIAGNOSIS — S52612K Displaced fracture of left ulna styloid process, subsequent encounter for closed fracture with nonunion: Secondary | ICD-10-CM | POA: Diagnosis not present

## 2016-10-05 DIAGNOSIS — M25532 Pain in left wrist: Secondary | ICD-10-CM

## 2016-10-05 NOTE — Progress Notes (Signed)
   Subjective: Left wrist pain    Patient ID: Venida JarvisSusan H Wion, female    DOB: 11/22/1960, 56 y.o.   MRN: [id number]  HPI Patient c/o 4 days of left wrist pain and swelling. States had a "wild weekend" at a concert. Do not remember provocative incident. Rates pain as 5/10. No palliative measure for compliant. Patient is right hand dominate.    Review of Systems Negative except for complaint.    Objective:   Physical Exam No obvious deformity. Moderate edema w/o erythema to distal ulnar of left wrist. Moderate guarding with palpation.  X-ray of left wrist is consistent with Scapholunate ligament injury      Assessment & Plan: Left wrist pain.  Left wrist pain/swelling 2nd to ligament strain. Patinet placed in wrist splint and advised OTC NSAID. Follow up with PCP 7-10 days as needed,

## 2016-12-05 DIAGNOSIS — F419 Anxiety disorder, unspecified: Secondary | ICD-10-CM | POA: Diagnosis not present

## 2016-12-05 DIAGNOSIS — F329 Major depressive disorder, single episode, unspecified: Secondary | ICD-10-CM | POA: Diagnosis not present

## 2016-12-15 DIAGNOSIS — F1314 Sedative, hypnotic or anxiolytic abuse with sedative, hypnotic or anxiolytic-induced mood disorder: Secondary | ICD-10-CM | POA: Diagnosis not present

## 2016-12-15 DIAGNOSIS — T1491XA Suicide attempt, initial encounter: Secondary | ICD-10-CM | POA: Diagnosis not present

## 2016-12-16 DIAGNOSIS — F1094 Alcohol use, unspecified with alcohol-induced mood disorder: Secondary | ICD-10-CM | POA: Diagnosis not present

## 2016-12-16 DIAGNOSIS — T1491XS Suicide attempt, sequela: Secondary | ICD-10-CM | POA: Diagnosis not present

## 2016-12-16 DIAGNOSIS — T1491XA Suicide attempt, initial encounter: Secondary | ICD-10-CM | POA: Diagnosis not present

## 2016-12-16 DIAGNOSIS — F1994 Other psychoactive substance use, unspecified with psychoactive substance-induced mood disorder: Secondary | ICD-10-CM | POA: Diagnosis not present

## 2016-12-16 DIAGNOSIS — F329 Major depressive disorder, single episode, unspecified: Secondary | ICD-10-CM | POA: Diagnosis not present

## 2016-12-16 DIAGNOSIS — F1314 Sedative, hypnotic or anxiolytic abuse with sedative, hypnotic or anxiolytic-induced mood disorder: Secondary | ICD-10-CM | POA: Diagnosis not present

## 2016-12-16 DIAGNOSIS — Z720 Tobacco use: Secondary | ICD-10-CM | POA: Diagnosis not present

## 2016-12-16 DIAGNOSIS — Z915 Personal history of self-harm: Secondary | ICD-10-CM | POA: Diagnosis not present

## 2016-12-16 DIAGNOSIS — R400 Somnolence: Secondary | ICD-10-CM | POA: Diagnosis not present

## 2016-12-16 DIAGNOSIS — F419 Anxiety disorder, unspecified: Secondary | ICD-10-CM | POA: Diagnosis not present

## 2016-12-16 DIAGNOSIS — F17210 Nicotine dependence, cigarettes, uncomplicated: Secondary | ICD-10-CM | POA: Diagnosis not present

## 2016-12-16 DIAGNOSIS — F1099 Alcohol use, unspecified with unspecified alcohol-induced disorder: Secondary | ICD-10-CM | POA: Diagnosis not present

## 2016-12-16 DIAGNOSIS — F1390 Sedative, hypnotic, or anxiolytic use, unspecified, uncomplicated: Secondary | ICD-10-CM | POA: Diagnosis not present

## 2016-12-16 DIAGNOSIS — Z789 Other specified health status: Secondary | ICD-10-CM | POA: Diagnosis not present

## 2017-01-17 DIAGNOSIS — F329 Major depressive disorder, single episode, unspecified: Secondary | ICD-10-CM | POA: Diagnosis not present

## 2017-01-17 DIAGNOSIS — F419 Anxiety disorder, unspecified: Secondary | ICD-10-CM | POA: Diagnosis not present

## 2017-03-07 DIAGNOSIS — F329 Major depressive disorder, single episode, unspecified: Secondary | ICD-10-CM | POA: Diagnosis not present

## 2017-03-07 DIAGNOSIS — F419 Anxiety disorder, unspecified: Secondary | ICD-10-CM | POA: Diagnosis not present

## 2017-04-06 DIAGNOSIS — F419 Anxiety disorder, unspecified: Secondary | ICD-10-CM | POA: Diagnosis not present

## 2017-04-06 DIAGNOSIS — F329 Major depressive disorder, single episode, unspecified: Secondary | ICD-10-CM | POA: Diagnosis not present

## 2017-05-16 DIAGNOSIS — F329 Major depressive disorder, single episode, unspecified: Secondary | ICD-10-CM | POA: Diagnosis not present

## 2017-05-16 DIAGNOSIS — F419 Anxiety disorder, unspecified: Secondary | ICD-10-CM | POA: Diagnosis not present

## 2017-09-06 DIAGNOSIS — F419 Anxiety disorder, unspecified: Secondary | ICD-10-CM | POA: Diagnosis not present

## 2017-09-06 DIAGNOSIS — F329 Major depressive disorder, single episode, unspecified: Secondary | ICD-10-CM | POA: Diagnosis not present

## 2017-10-13 DIAGNOSIS — F419 Anxiety disorder, unspecified: Secondary | ICD-10-CM | POA: Diagnosis not present

## 2017-10-13 DIAGNOSIS — F329 Major depressive disorder, single episode, unspecified: Secondary | ICD-10-CM | POA: Diagnosis not present

## 2017-12-07 DIAGNOSIS — F419 Anxiety disorder, unspecified: Secondary | ICD-10-CM | POA: Diagnosis not present

## 2017-12-07 DIAGNOSIS — F329 Major depressive disorder, single episode, unspecified: Secondary | ICD-10-CM | POA: Diagnosis not present

## 2018-01-04 DIAGNOSIS — F329 Major depressive disorder, single episode, unspecified: Secondary | ICD-10-CM | POA: Diagnosis not present

## 2018-01-04 DIAGNOSIS — F419 Anxiety disorder, unspecified: Secondary | ICD-10-CM | POA: Diagnosis not present

## 2018-02-07 DIAGNOSIS — F419 Anxiety disorder, unspecified: Secondary | ICD-10-CM | POA: Diagnosis not present

## 2018-02-07 DIAGNOSIS — F329 Major depressive disorder, single episode, unspecified: Secondary | ICD-10-CM | POA: Diagnosis not present

## 2018-03-12 DIAGNOSIS — F419 Anxiety disorder, unspecified: Secondary | ICD-10-CM | POA: Diagnosis not present

## 2018-03-12 DIAGNOSIS — F329 Major depressive disorder, single episode, unspecified: Secondary | ICD-10-CM | POA: Diagnosis not present

## 2018-03-29 DIAGNOSIS — F332 Major depressive disorder, recurrent severe without psychotic features: Secondary | ICD-10-CM | POA: Diagnosis not present

## 2018-04-05 DIAGNOSIS — F332 Major depressive disorder, recurrent severe without psychotic features: Secondary | ICD-10-CM | POA: Diagnosis not present

## 2018-04-06 DIAGNOSIS — F332 Major depressive disorder, recurrent severe without psychotic features: Secondary | ICD-10-CM | POA: Diagnosis not present

## 2018-04-09 DIAGNOSIS — F332 Major depressive disorder, recurrent severe without psychotic features: Secondary | ICD-10-CM | POA: Diagnosis not present

## 2018-04-10 DIAGNOSIS — F332 Major depressive disorder, recurrent severe without psychotic features: Secondary | ICD-10-CM | POA: Diagnosis not present

## 2018-04-11 DIAGNOSIS — F332 Major depressive disorder, recurrent severe without psychotic features: Secondary | ICD-10-CM | POA: Diagnosis not present

## 2018-04-12 DIAGNOSIS — F332 Major depressive disorder, recurrent severe without psychotic features: Secondary | ICD-10-CM | POA: Diagnosis not present

## 2018-04-13 DIAGNOSIS — F332 Major depressive disorder, recurrent severe without psychotic features: Secondary | ICD-10-CM | POA: Diagnosis not present

## 2018-04-16 DIAGNOSIS — F329 Major depressive disorder, single episode, unspecified: Secondary | ICD-10-CM | POA: Diagnosis not present

## 2018-04-16 DIAGNOSIS — F419 Anxiety disorder, unspecified: Secondary | ICD-10-CM | POA: Diagnosis not present

## 2018-04-16 DIAGNOSIS — F332 Major depressive disorder, recurrent severe without psychotic features: Secondary | ICD-10-CM | POA: Diagnosis not present

## 2018-04-17 DIAGNOSIS — F332 Major depressive disorder, recurrent severe without psychotic features: Secondary | ICD-10-CM | POA: Diagnosis not present

## 2018-04-18 DIAGNOSIS — F332 Major depressive disorder, recurrent severe without psychotic features: Secondary | ICD-10-CM | POA: Diagnosis not present

## 2018-04-19 DIAGNOSIS — F332 Major depressive disorder, recurrent severe without psychotic features: Secondary | ICD-10-CM | POA: Diagnosis not present

## 2018-04-20 DIAGNOSIS — F332 Major depressive disorder, recurrent severe without psychotic features: Secondary | ICD-10-CM | POA: Diagnosis not present

## 2018-04-23 DIAGNOSIS — F332 Major depressive disorder, recurrent severe without psychotic features: Secondary | ICD-10-CM | POA: Diagnosis not present

## 2018-04-24 DIAGNOSIS — F332 Major depressive disorder, recurrent severe without psychotic features: Secondary | ICD-10-CM | POA: Diagnosis not present

## 2018-04-25 DIAGNOSIS — F332 Major depressive disorder, recurrent severe without psychotic features: Secondary | ICD-10-CM | POA: Diagnosis not present

## 2018-04-26 DIAGNOSIS — F332 Major depressive disorder, recurrent severe without psychotic features: Secondary | ICD-10-CM | POA: Diagnosis not present

## 2018-04-27 DIAGNOSIS — F332 Major depressive disorder, recurrent severe without psychotic features: Secondary | ICD-10-CM | POA: Diagnosis not present

## 2018-04-30 DIAGNOSIS — F332 Major depressive disorder, recurrent severe without psychotic features: Secondary | ICD-10-CM | POA: Diagnosis not present

## 2018-05-01 DIAGNOSIS — F332 Major depressive disorder, recurrent severe without psychotic features: Secondary | ICD-10-CM | POA: Diagnosis not present

## 2018-05-02 DIAGNOSIS — F332 Major depressive disorder, recurrent severe without psychotic features: Secondary | ICD-10-CM | POA: Diagnosis not present

## 2018-05-03 DIAGNOSIS — F332 Major depressive disorder, recurrent severe without psychotic features: Secondary | ICD-10-CM | POA: Diagnosis not present

## 2018-05-04 DIAGNOSIS — F332 Major depressive disorder, recurrent severe without psychotic features: Secondary | ICD-10-CM | POA: Diagnosis not present

## 2018-05-07 DIAGNOSIS — F332 Major depressive disorder, recurrent severe without psychotic features: Secondary | ICD-10-CM | POA: Diagnosis not present

## 2018-05-08 DIAGNOSIS — F332 Major depressive disorder, recurrent severe without psychotic features: Secondary | ICD-10-CM | POA: Diagnosis not present

## 2018-05-09 DIAGNOSIS — F332 Major depressive disorder, recurrent severe without psychotic features: Secondary | ICD-10-CM | POA: Diagnosis not present

## 2018-05-10 DIAGNOSIS — F332 Major depressive disorder, recurrent severe without psychotic features: Secondary | ICD-10-CM | POA: Diagnosis not present

## 2018-05-11 DIAGNOSIS — F332 Major depressive disorder, recurrent severe without psychotic features: Secondary | ICD-10-CM | POA: Diagnosis not present

## 2018-05-15 DIAGNOSIS — F332 Major depressive disorder, recurrent severe without psychotic features: Secondary | ICD-10-CM | POA: Diagnosis not present

## 2018-05-16 DIAGNOSIS — F332 Major depressive disorder, recurrent severe without psychotic features: Secondary | ICD-10-CM | POA: Diagnosis not present

## 2018-05-17 DIAGNOSIS — F332 Major depressive disorder, recurrent severe without psychotic features: Secondary | ICD-10-CM | POA: Diagnosis not present

## 2018-05-18 DIAGNOSIS — F332 Major depressive disorder, recurrent severe without psychotic features: Secondary | ICD-10-CM | POA: Diagnosis not present

## 2018-05-21 DIAGNOSIS — F332 Major depressive disorder, recurrent severe without psychotic features: Secondary | ICD-10-CM | POA: Diagnosis not present

## 2018-05-23 DIAGNOSIS — F332 Major depressive disorder, recurrent severe without psychotic features: Secondary | ICD-10-CM | POA: Diagnosis not present

## 2018-05-24 DIAGNOSIS — F332 Major depressive disorder, recurrent severe without psychotic features: Secondary | ICD-10-CM | POA: Diagnosis not present

## 2018-05-28 DIAGNOSIS — F332 Major depressive disorder, recurrent severe without psychotic features: Secondary | ICD-10-CM | POA: Diagnosis not present

## 2018-05-30 DIAGNOSIS — F332 Major depressive disorder, recurrent severe without psychotic features: Secondary | ICD-10-CM | POA: Diagnosis not present

## 2018-06-27 DIAGNOSIS — F329 Major depressive disorder, single episode, unspecified: Secondary | ICD-10-CM | POA: Diagnosis not present

## 2018-06-27 DIAGNOSIS — F419 Anxiety disorder, unspecified: Secondary | ICD-10-CM | POA: Diagnosis not present

## 2018-07-05 DIAGNOSIS — F332 Major depressive disorder, recurrent severe without psychotic features: Secondary | ICD-10-CM | POA: Diagnosis not present

## 2018-10-17 DIAGNOSIS — F329 Major depressive disorder, single episode, unspecified: Secondary | ICD-10-CM | POA: Diagnosis not present

## 2018-10-17 DIAGNOSIS — F419 Anxiety disorder, unspecified: Secondary | ICD-10-CM | POA: Diagnosis not present

## 2020-01-26 DIAGNOSIS — U071 COVID-19: Secondary | ICD-10-CM | POA: Diagnosis not present

## 2020-04-29 ENCOUNTER — Other Ambulatory Visit (HOSPITAL_COMMUNITY): Payer: Self-pay

## 2020-04-29 DIAGNOSIS — Z8659 Personal history of other mental and behavioral disorders: Secondary | ICD-10-CM | POA: Diagnosis not present

## 2020-04-29 DIAGNOSIS — Z8619 Personal history of other infectious and parasitic diseases: Secondary | ICD-10-CM | POA: Diagnosis not present

## 2020-04-29 DIAGNOSIS — J329 Chronic sinusitis, unspecified: Secondary | ICD-10-CM | POA: Diagnosis not present

## 2020-09-11 DIAGNOSIS — H5203 Hypermetropia, bilateral: Secondary | ICD-10-CM | POA: Diagnosis not present

## 2020-09-11 DIAGNOSIS — H524 Presbyopia: Secondary | ICD-10-CM | POA: Diagnosis not present
